# Patient Record
Sex: Male | Born: 1970 | Race: White | Hispanic: No | Marital: Single | Smoking: Current every day smoker
Health system: Southern US, Community
[De-identification: ages and names within clinical notes are randomized; demographics above are authoritative.]

## PROBLEM LIST (undated history)

## (undated) DIAGNOSIS — S060XAA Concussion with loss of consciousness status unknown, initial encounter: Secondary | ICD-10-CM

## (undated) HISTORY — DX: Concussion with loss of consciousness status unknown, initial encounter: S06.0XAA

---

## 2017-08-05 ENCOUNTER — Emergency Department (HOSPITAL_COMMUNITY): Payer: Self-pay

## 2017-08-05 ENCOUNTER — Emergency Department (HOSPITAL_COMMUNITY)
Admission: EM | Admit: 2017-08-05 | Discharge: 2017-08-05 | Disposition: A | Discharge status: Home / Self Care | Payer: Self-pay | Attending: Emergency Medicine | Admitting: Emergency Medicine

## 2017-08-05 DIAGNOSIS — R079 Chest pain, unspecified: Secondary | ICD-10-CM | POA: Insufficient documentation

## 2017-08-05 DIAGNOSIS — K2900 Acute gastritis without bleeding: Secondary | ICD-10-CM | POA: Insufficient documentation

## 2017-08-05 LAB — URINALYSIS, COMPLETE (UACMP) WITH MICROSCOPIC
Bilirubin Urine: NEGATIVE
Glucose, UA: NEGATIVE mg/dL
Hgb urine dipstick: NEGATIVE
Ketones, ur: 5 mg/dL — AB
Leukocytes, UA: NEGATIVE
Nitrite: NEGATIVE
Protein, ur: NEGATIVE mg/dL
Specific Gravity, Urine: >1.046 — ABNORMAL HIGH (ref 1.005–1.030)
Squamous Epithelial / LPF: NONE SEEN
pH: 7 (ref 5.0–8.0)

## 2017-08-05 LAB — COMPREHENSIVE METABOLIC PANEL
ALBUMIN: 4 g/dL (ref 3.5–5.0)
ALK PHOS: 80 U/L (ref 38–126)
ALT: 14 U/L — ABNORMAL LOW (ref 17–63)
ANION GAP: 11 (ref 5–15)
AST: 24 U/L (ref 15–41)
BUN: 16 mg/dL (ref 6–20)
CALCIUM: 9.2 mg/dL (ref 8.9–10.3)
CO2: 21 mmol/L — ABNORMAL LOW (ref 22–32)
Chloride: 102 mmol/L (ref 101–111)
Creatinine, Ser: 1.12 mg/dL (ref 0.61–1.24)
GLUCOSE: 96 mg/dL (ref 65–99)
Potassium: 4 mmol/L (ref 3.5–5.1)
Sodium: 134 mmol/L — ABNORMAL LOW (ref 135–145)
Total Bilirubin: 0.9 mg/dL (ref 0.3–1.2)
Total Protein: 6.9 g/dL (ref 6.5–8.1)

## 2017-08-05 LAB — RAPID URINE DRUG SCREEN, HOSP PERFORMED
Amphetamines: NOT DETECTED
Barbiturates: NOT DETECTED
Benzodiazepines: NOT DETECTED
Cocaine: NOT DETECTED
Opiates: NOT DETECTED
Tetrahydrocannabinol: POSITIVE — AB

## 2017-08-05 LAB — CBC
HEMATOCRIT: 39.7 % (ref 39.0–52.0)
HEMOGLOBIN: 13.8 g/dL (ref 13.0–17.0)
MCH: 29.8 pg (ref 26.0–34.0)
MCHC: 34.8 g/dL (ref 30.0–36.0)
MCV: 85.7 fL (ref 78.0–100.0)
Platelets: 262 10*3/uL (ref 150–400)
RBC: 4.63 MIL/uL (ref 4.22–5.81)
RDW: 13.1 % (ref 11.5–15.5)
WBC: 13.8 10*3/uL — ABNORMAL HIGH (ref 4.0–10.5)

## 2017-08-05 LAB — I-STAT VENOUS BLOOD GAS, ED
Acid-base deficit: 1 mmol/L (ref 0.0–2.0)
BICARBONATE: 22.4 mmol/L (ref 20.0–28.0)
O2 SAT: 46 %
PCO2 VEN: 32.5 mmHg — AB (ref 44.0–60.0)
TCO2: 23 mmol/L (ref 22–32)
pH, Ven: 7.447 — ABNORMAL HIGH (ref 7.250–7.430)
pO2, Ven: 24 mmHg — CL (ref 32.0–45.0)

## 2017-08-05 LAB — I-STAT CHEM 8, ED
BUN: 17 mg/dL (ref 6–20)
CALCIUM ION: 1.09 mmol/L — AB (ref 1.15–1.40)
CREATININE: 1.1 mg/dL (ref 0.61–1.24)
Chloride: 102 mmol/L (ref 101–111)
Glucose, Bld: 159 mg/dL — ABNORMAL HIGH (ref 65–99)
HCT: 44 % (ref 39.0–52.0)
Hemoglobin: 15 g/dL (ref 13.0–17.0)
Potassium: 3.9 mmol/L (ref 3.5–5.1)
Sodium: 137 mmol/L (ref 135–145)
TCO2: 22 mmol/L (ref 22–32)

## 2017-08-05 LAB — I-STAT TROPONIN, ED: Troponin i, poc: 0.01 ng/mL (ref 0.00–0.08)

## 2017-08-05 LAB — LIPASE, BLOOD: Lipase: 25 U/L (ref 11–51)

## 2017-08-05 LAB — ETHANOL: Alcohol, Ethyl (B): <10 mg/dL (ref <10)

## 2017-08-05 MED ORDER — FENTANYL CITRATE (PF) 100 MCG/2ML IJ SOLN: 50.0000 ug | Freq: Once | INTRAMUSCULAR | Status: DC

## 2017-08-05 MED ORDER — OMEPRAZOLE 20 MG PO CPDR: 20.0000 mg | DELAYED_RELEASE_CAPSULE | Freq: Every day | ORAL | 0 refills | Status: AC

## 2017-08-05 MED ORDER — MORPHINE SULFATE (PF) 4 MG/ML IV SOLN: 4.0000 mg | Freq: Once | INTRAVENOUS | Status: DC

## 2017-08-05 MED ORDER — SODIUM CHLORIDE 0.9 % IV BOLUS (SEPSIS)
1000.0000 mL | Freq: Once | INTRAVENOUS | Status: AC
  Administered 2017-08-05: 1000 mL via INTRAVENOUS

## 2017-08-05 MED ORDER — BUTALBITAL-APAP-CAFFEINE 50-325-40 MG PO TABS: 1.0000 | ORAL_TABLET | Freq: Four times a day (QID) | ORAL | 0 refills | Status: AC | PRN

## 2017-08-05 MED ORDER — ONDANSETRON HCL 4 MG/2ML IJ SOLN
INTRAMUSCULAR | Status: AC
  Filled 2017-08-05: qty 2

## 2017-08-05 MED ORDER — IOPAMIDOL (ISOVUE-370) INJECTION 76%
INTRAVENOUS | Status: AC
  Administered 2017-08-05: 100 mL
  Filled 2017-08-05: qty 100

## 2017-08-05 MED ORDER — ONDANSETRON HCL 4 MG/2ML IJ SOLN
4.0000 mg | Freq: Once | INTRAMUSCULAR | Status: AC
  Administered 2017-08-05: 4 mg via INTRAVENOUS

## 2017-08-05 MED ORDER — ONDANSETRON 4 MG PO TBDP: 4.0000 mg | ORAL_TABLET | Freq: Three times a day (TID) | ORAL | 0 refills | Status: DC | PRN

## 2017-08-05 MED ORDER — SUCRALFATE 1 G PO TABS: 1.0000 g | ORAL_TABLET | Freq: Three times a day (TID) | ORAL | 0 refills | Status: AC

## 2017-08-05 MED ORDER — GI COCKTAIL ~~LOC~~
30.0000 mL | Freq: Once | ORAL | Status: AC
  Administered 2017-08-05: 30 mL via ORAL
  Filled 2017-08-05: qty 30

## 2017-08-05 NOTE — ED Notes (Signed)
ED Provider at bedside. 
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
## 2017-08-05 NOTE — ED Triage Notes (Signed)
Pt in POV, visitors identify themselves as employeers and comes to the nurse first desk stating, " I need help. He is having a heart attack." this RN went to vehicle with NT and placed pt on stretcher from truck with limited help, pt alert and states, "I can't help you. I cannot stand." pt taken to triage for EKG and vitals, pt then starts to speak garbled language, EKG and vitals to be complete and pt will be taken to Trauma A room for physician eval

## 2017-08-05 NOTE — ED Provider Notes (Addendum)
[facility]   CSN: [id number] Arrival date & time: 08/05/17  1321     History   Chief Complaint Chief Complaint  Patient presents with  . Altered Mental Status    HPI Kerry Kassimothy Castelli is a 47 y.o. male.  HPI   47 year old male with no reported past medical history here with altered mental status.  History is somewhat limited due to patient severe distress and confusion.  According to his employers who brought him in, the patient is normally alert, oriented.  He showed up for his check today and was very confused.  Is complaining of severe chest and epigastric pain.  Since then, he has been increasingly confused, agitated, with severe epigastric pain radiating towards his back.  He said multiple episodes of nonbloody emesis.  He also reports that his lower extremities feel "numb" and "cold."  He has no history of previous similar symptoms.  Denies any numbness or weakness.  He does state he has some numbness in his right arm.  No known history of aortic disease.  He does smoke.  Denies any drug use.  Denies history of gastric ulcers or pancreatitis.  He was given aspirin by his coworkers prior to arrival.  Pain is sharp, stabbing, tearing.  No past medical history on file.  There are no active problems to display for this patient.    Home Medications    Prior to Admission medications   Medication Sig Start Date End Date Taking? Authorizing Provider  aspirin-acetaminophen-caffeine (EXCEDRIN MIGRAINE) [phone] MG tablet Take 3 tablets by mouth every 6 (six) hours as needed for migraine.    Yes [provider]  butalbital-acetaminophen-caffeine (FIORICET, ESGIC) 50-325-40 MG tablet Take 1 tablet by mouth every 6 (six) hours as needed for headache or migraine. 08/05/17 08/05/18  Shaune PollackIsaacs, Marjie Chea, MD  omeprazole (PRILOSEC) 20 MG capsule Take 1 capsule (20 mg total) by mouth daily for 14 days. 30 minutes before eating, first thing  in the morning 08/05/17 08/19/17  Shaune PollackIsaacs, Hasheem Voland, MD  ondansetron (ZOFRAN ODT) 4 MG disintegrating tablet Take 1 tablet (4 mg total) by mouth every 8 (eight) hours as needed for nausea or vomiting. 08/05/17   Shaune PollackIsaacs, Patsie Mccardle, MD  sucralfate (CARAFATE) 1 g tablet Take 1 tablet (1 g total) by mouth 4 (four) times daily -  with meals and at bedtime for 10 days. 08/05/17 08/15/17  Shaune PollackIsaacs, Keva Darty, MD    Family History No family history on file.  Social History Social History   Tobacco Use  . Smoking status: Not on file  Substance Use Topics  . Alcohol use: Not on file  . Drug use: Not on file     Allergies   Penicillins   Review of Systems Review of Systems  Constitutional: Positive for fatigue.  Gastrointestinal: Positive for abdominal pain, nausea and vomiting.  Neurological: Positive for weakness.  Psychiatric/Behavioral: Positive for confusion.  All other systems reviewed and are negative.    Physical Exam Updated Vital Signs BP (!) 102/45   Pulse (!) 59   Resp (!) 22   Ht 6' (1.829 m)   Wt 74.8 kg (165 lb)   SpO2 100%   BMI 22.38 kg/m   Physical Exam  Constitutional: He appears well-developed and well-nourished. He appears distressed.  HENT:  Head: Normocephalic and atraumatic.  markedly poor dentition  Eyes: Conjunctivae are normal.  Neck: Neck supple.  Cardiovascular: Normal rate, regular rhythm and normal heart sounds. Exam reveals no friction rub.  No murmur heard. Pulses are 2+ on right radial, 1+ otherwise  Pulmonary/Chest: Effort normal and breath sounds normal. No respiratory distress. He has no wheezes. He has no rales.  Abdominal: Soft. He exhibits no distension. There is tenderness. There is guarding.  Musculoskeletal: He exhibits no edema.  Neurological: He is alert. He exhibits normal muscle tone.  Oriented to person and place at this time, intermittently confused on time.  Moves all extremities with out of 5 strength.  Able to wiggle toes without  difficulty.  Speech appears normal.  Face is symmetric.  Skin: Skin is warm. Capillary refill takes less than 2 seconds.  Psychiatric: He has a normal mood and affect.  Nursing note and vitals reviewed.    ED Treatments / Results  Labs (all labs ordered are listed, but only abnormal results are displayed) Labs Reviewed  URINALYSIS, COMPLETE (UACMP) WITH MICROSCOPIC - Abnormal; Notable for the following components:      Result Value   Specific Gravity, Urine >1.046 (*)    Ketones, ur 5 (*)    Bacteria, UA RARE (*)    All other components within normal limits  RAPID URINE DRUG SCREEN, HOSP PERFORMED - Abnormal; Notable for the following components:   Tetrahydrocannabinol POSITIVE (*)    All other components within normal limits  CBC - Abnormal; Notable for the following components:   WBC 13.8 (*)    All other components within normal limits  COMPREHENSIVE METABOLIC PANEL - Abnormal; Notable for the following components:   Sodium 134 (*)    CO2 21 (*)    ALT 14 (*)    All other components within normal limits  I-STAT CHEM 8, ED - Abnormal; Notable for the following components:   Glucose, Bld 159 (*)    Calcium, Ion 1.09 (*)    All other components within normal limits  I-STAT VENOUS BLOOD GAS, ED - Abnormal; Notable for the following components:   pH, Ven 7.447 (*)    pCO2, Ven 32.5 (*)    pO2, Ven 24.0 (*)    All other components within normal limits  ETHANOL  LIPASE, BLOOD  CBC WITH DIFFERENTIAL/PLATELET  URINALYSIS, ROUTINE W REFLEX MICROSCOPIC  I-STAT TROPONIN, ED    EKG Normal sinus rhythm, VR 78. PR 134, QRS 94, QTc 471. Minimal criteria for LVH. No ST elevations or depressions. No old tracing to compare.  Radiology Ct Head Wo Contrast  Result Date: 08/05/2017 CLINICAL DATA:  Sudden onset altered level of consciousness. EXAM: CT HEAD WITHOUT CONTRAST TECHNIQUE: Contiguous axial images were obtained from the base of the skull through the vertex without intravenous  contrast. COMPARISON:  None. FINDINGS: Brain: No evidence of acute infarction, hemorrhage, hydrocephalus, extra-axial collection or mass lesion/mass effect. Vascular: No hyperdense vessel or unexpected calcification. Skull: No osseous abnormality. Sinuses/Orbits: Visualized paranasal sinuses are clear. Visualized mastoid sinuses are clear. Visualized orbits demonstrate no focal abnormality. Other: None IMPRESSION: No acute intracranial pathology. Electronically Signed   By: Elige Ko   On: 08/05/2017 14:18   Ct Angio Chest/abd/pel For Dissection W And/or Wo Contrast  Result Date: 08/05/2017 CLINICAL DATA:  Sudden onset of altered level of consciousness with upper abdominal pain and nausea. EXAM: CT ANGIOGRAPHY CHEST, ABDOMEN AND PELVIS TECHNIQUE: Multidetector CT imaging through the chest, abdomen and pelvis was performed using the standard protocol during bolus administration of intravenous contrast. Multiplanar reconstructed images and MIPs were obtained and reviewed to evaluate the vascular anatomy. CONTRAST:  ISOVUE-370 IOPAMIDOL (ISOVUE-370) INJECTION 76% COMPARISON:  None. FINDINGS: CTA CHEST FINDINGS Cardiovascular: The aorta appears normal. Branching pattern of the brachiocephalic vessels from the arch is normal with exception that the left vertebral artery arises directly from the arch. No brachiocephalic pathology seen to the level of the thyroid glands. The aorta shows normal caliber without atherosclerotic change, aneurysm or dissection. The patient does have a focal calcification in the left coronary system. Heart size is normal. No pericardial fluid. Pulmonary arteries are normal. Mediastinum/Nodes: No mass or lymphadenopathy. Lungs/Pleura: Normal Musculoskeletal: Normal Review of the MIP images confirms the above findings. CTA ABDOMEN AND PELVIS FINDINGS VASCULAR Aorta: Minimal atherosclerotic plaque.  No aneurysm or dissection. Celiac: Normal SMA: Normal Renals: Single on each side.   Wide patency. IMA: Normal Inflow: Normal Veins: No abnormality seen. Review of the MIP images confirms the above findings. NON-VASCULAR Hepatobiliary: Normal Pancreas: Normal Spleen: Normal Adrenals/Urinary Tract: Adrenal glands are normal. Kidneys are normal. Bladder is normal. Stomach/Bowel: Normal Lymphatic: Normal Reproductive: Normal Other: No free fluid or air. Musculoskeletal: Ordinary mild lumbar degenerative changes. Review of the MIP images confirms the above findings. IMPRESSION: No acute finding to explain the clinical presentation. The chest is normal with exception of a single punctate calcification in the left coronary system. Abdomen and pelvis are normal with exception of some atherosclerotic plaque of the abdominal aorta but no aneurysm or dissection. Branch vessels all appear normal. Electronically Signed   By: Paulina Fusi M.D.   On: 08/05/2017 14:24    Procedures .Critical Care Performed by: Shaune Pollack, MD Authorized by: Shaune Pollack, MD   Critical care provider statement:    Critical care time (minutes):  35   Critical care time was exclusive of:  Separately billable procedures and treating other patients and teaching time   Critical care was necessary to treat or prevent imminent or life-threatening deterioration of the following conditions:  Circulatory failure, cardiac failure and dehydration   Critical care was time spent personally by me on the following activities:  Development of treatment plan with patient or surrogate, discussions with consultants, evaluation of patient's response to treatment, examination of patient, obtaining history from patient or surrogate, ordering and performing treatments and interventions, ordering and review of laboratory studies, ordering and review of radiographic studies, pulse oximetry, re-evaluation of patient's condition and review of old charts   I assumed direction of critical care for this patient from another provider in my  specialty: no     (including critical care time)  Medications Ordered in ED Medications  fentaNYL (SUBLIMAZE) injection 50 mcg (0 mcg Intravenous Hold 08/05/17 1601)  ondansetron (ZOFRAN) 4 MG/2ML injection (not administered)  morphine 4 MG/ML injection 4 mg (0 mg Intravenous Hold 08/05/17 1602)  ondansetron (ZOFRAN) injection 4 mg (4 mg Intravenous Given 08/05/17 1424)  sodium chloride 0.9 % bolus 1,000 mL (0 mLs Intravenous Stopped 08/05/17 1539)  iopamidol (ISOVUE-370) 76 % injection (100 mLs  Contrast Given 08/05/17 1355)  gi cocktail (Maalox,Lidocaine,Donnatal) (30 mLs Oral Given 08/05/17 1733)  sodium chloride 0.9 % bolus 1,000 mL (0 mLs Intravenous Stopped 08/05/17 1734)     Initial Impression / Assessment and Plan / ED Course  I have reviewed the triage vital signs and the nursing notes.  Pertinent labs & imaging results that were available during my care of the patient were reviewed by me and considered in my medical decision making (see chart for details).     47 year old male with past medical history as above here with altered mental status and epigastric pain.  Patient in obvious distress on arrival.  Given his severe abdominal and chest pain radiating to his back with neurological symptoms and his degree of distress, concern for possible aortic dissection.  Also concern for possible intracranial bleed.  He has no focal deficits to suggest stroke.  Blood pressure is normal.  Will check CT head, sent for stat CT angios, and give fluids and pain control.  CT scan and labs are very reassuring.  Fortunately, there is no evidence of dissection or other acute normality.  The patient does have moderate dehydration and has been given fluids for this.  He feels markedly improved with GI cocktail.  I suspect his symptoms are likely due to severe gastritis, possibly with nonbleeding ulcer.  He also has positive for marijuana so could consider gastroparesis or marijuana induced hyperemesis.  He  is now tolerating p.o. without difficulty.  EKG is nonischemic.  Doubt ACS.  Will give him antacids, and alternative medicine for his migraines, and refer him to GI.  Final Clinical Impressions(s) / ED Diagnoses   Final diagnoses:  Acute superficial gastritis without hemorrhage    ED Discharge Orders        Ordered    omeprazole (PRILOSEC) 20 MG capsule  Daily     08/05/17 1806    sucralfate (CARAFATE) 1 g tablet  3 times daily with meals & bedtime     08/05/17 1806    butalbital-acetaminophen-caffeine (FIORICET, ESGIC) 50-325-40 MG tablet  Every 6 hours PRN     08/05/17 1806    ondansetron (ZOFRAN ODT) 4 MG disintegrating tablet  Every 8 hours PRN     08/05/17 1808       Shaune Pollack, MD 08/05/17 1810    Shaune Pollack, MD 08/15/17 1034

## 2017-08-05 NOTE — Discharge Instructions (Signed)
It is very important that you stop taking all Excedrin, aspirin, Advil, naproxen, or other NSAID medications  I prescribed a medication for you to take for your migraines.  Take this sparingly, as needed.  Take the full course of antacids as prescribed.

## 2017-08-05 NOTE — ED Notes (Signed)
Lab will add on CMP to previously collected sample

## 2017-08-05 NOTE — ED Notes (Signed)
Patient verbalizes understanding of discharge instructions. Opportunity for questioning and answers were provided. Armband removed by staff, pt discharged from ED ambulatory.   

## 2018-08-30 ENCOUNTER — Other Ambulatory Visit: Payer: Self-pay | Admitting: Occupational Medicine

## 2018-08-30 ENCOUNTER — Ambulatory Visit: Payer: Self-pay

## 2018-08-30 ENCOUNTER — Other Ambulatory Visit: Payer: Self-pay | Admitting: Family Medicine

## 2018-08-30 DIAGNOSIS — M79671 Pain in right foot: Secondary | ICD-10-CM

## 2018-12-10 ENCOUNTER — Emergency Department (HOSPITAL_COMMUNITY): Payer: Self-pay

## 2018-12-10 ENCOUNTER — Other Ambulatory Visit: Payer: Self-pay

## 2018-12-10 ENCOUNTER — Emergency Department (HOSPITAL_COMMUNITY)
Admission: EM | Admit: 2018-12-10 | Discharge: 2018-12-10 | Disposition: A | Discharge status: Home / Self Care | Payer: Self-pay | Attending: Emergency Medicine | Admitting: Emergency Medicine

## 2018-12-10 ENCOUNTER — Encounter (HOSPITAL_COMMUNITY): Payer: Self-pay | Admitting: Emergency Medicine

## 2018-12-10 DIAGNOSIS — S3991XA Unspecified injury of abdomen, initial encounter: Secondary | ICD-10-CM | POA: Insufficient documentation

## 2018-12-10 DIAGNOSIS — Y92838 Other recreation area as the place of occurrence of the external cause: Secondary | ICD-10-CM | POA: Insufficient documentation

## 2018-12-10 DIAGNOSIS — S299XXA Unspecified injury of thorax, initial encounter: Secondary | ICD-10-CM | POA: Insufficient documentation

## 2018-12-10 DIAGNOSIS — Y998 Other external cause status: Secondary | ICD-10-CM | POA: Insufficient documentation

## 2018-12-10 DIAGNOSIS — Y9355 Activity, bike riding: Secondary | ICD-10-CM | POA: Insufficient documentation

## 2018-12-10 DIAGNOSIS — S060X9A Concussion with loss of consciousness of unspecified duration, initial encounter: Secondary | ICD-10-CM | POA: Insufficient documentation

## 2018-12-10 LAB — URINALYSIS, ROUTINE W REFLEX MICROSCOPIC
Bilirubin Urine: NEGATIVE
Glucose, UA: NEGATIVE mg/dL
Hgb urine dipstick: NEGATIVE
Ketones, ur: 20 mg/dL — AB
Leukocytes,Ua: NEGATIVE
Nitrite: NEGATIVE
Protein, ur: NEGATIVE mg/dL
Specific Gravity, Urine: >1.046 — ABNORMAL HIGH (ref 1.005–1.030)
pH: 9 — ABNORMAL HIGH (ref 5.0–8.0)

## 2018-12-10 LAB — COMPREHENSIVE METABOLIC PANEL
ALT: 20 U/L (ref 0–44)
AST: 26 U/L (ref 15–41)
Albumin: 4.2 g/dL (ref 3.5–5.0)
Alkaline Phosphatase: 87 U/L (ref 38–126)
Anion gap: 14 (ref 5–15)
BUN: 15 mg/dL (ref 6–20)
CO2: 18 mmol/L — ABNORMAL LOW (ref 22–32)
Calcium: 9.5 mg/dL (ref 8.9–10.3)
Chloride: 102 mmol/L (ref 98–111)
Creatinine, Ser: 1.17 mg/dL (ref 0.61–1.24)
GFR calc Af Amer: >60 mL/min (ref >60)
GFR calc non Af Amer: >60 mL/min (ref >60)
Glucose, Bld: 173 mg/dL — ABNORMAL HIGH (ref 70–99)
Potassium: 3.5 mmol/L (ref 3.5–5.1)
Sodium: 134 mmol/L — ABNORMAL LOW (ref 135–145)
Total Bilirubin: 0.8 mg/dL (ref 0.3–1.2)
Total Protein: 7 g/dL (ref 6.5–8.1)

## 2018-12-10 LAB — I-STAT CHEM 8, ED
BUN: 16 mg/dL (ref 6–20)
Calcium, Ion: 1.02 mmol/L — ABNORMAL LOW (ref 1.15–1.40)
Chloride: 104 mmol/L (ref 98–111)
Creatinine, Ser: 1.1 mg/dL (ref 0.61–1.24)
Glucose, Bld: 168 mg/dL — ABNORMAL HIGH (ref 70–99)
HCT: 45 % (ref 39.0–52.0)
Hemoglobin: 15.3 g/dL (ref 13.0–17.0)
Potassium: 3.5 mmol/L (ref 3.5–5.1)
Sodium: 135 mmol/L (ref 135–145)
TCO2: 20 mmol/L — ABNORMAL LOW (ref 22–32)

## 2018-12-10 LAB — PROTIME-INR
INR: 1 (ref 0.8–1.2)
Prothrombin Time: 13 seconds (ref 11.4–15.2)

## 2018-12-10 LAB — CBC
HCT: 43.1 % (ref 39.0–52.0)
Hemoglobin: 15.4 g/dL (ref 13.0–17.0)
MCH: 30 pg (ref 26.0–34.0)
MCHC: 35.7 g/dL (ref 30.0–36.0)
MCV: 83.9 fL (ref 80.0–100.0)
Platelets: 282 10*3/uL (ref 150–400)
RBC: 5.14 MIL/uL (ref 4.22–5.81)
RDW: 13.1 % (ref 11.5–15.5)
WBC: 17.3 10*3/uL — ABNORMAL HIGH (ref 4.0–10.5)
nRBC: 0 % (ref 0.0–0.2)

## 2018-12-10 LAB — SAMPLE TO BLOOD BANK

## 2018-12-10 LAB — CDS SEROLOGY

## 2018-12-10 LAB — LACTIC ACID, PLASMA: Lactic Acid, Venous: 2.2 mmol/L (ref 0.5–1.9)

## 2018-12-10 LAB — RAPID URINE DRUG SCREEN, HOSP PERFORMED
Amphetamines: NOT DETECTED
Barbiturates: NOT DETECTED
Benzodiazepines: NOT DETECTED
Cocaine: NOT DETECTED
Opiates: NOT DETECTED
Tetrahydrocannabinol: POSITIVE — AB

## 2018-12-10 LAB — ETHANOL: Alcohol, Ethyl (B): <10 mg/dL (ref <10)

## 2018-12-10 MED ORDER — ONDANSETRON HCL 4 MG/2ML IJ SOLN
INTRAMUSCULAR | Status: AC
  Filled 2018-12-10: qty 2

## 2018-12-10 MED ORDER — SODIUM CHLORIDE 0.9 % IV SOLN
INTRAVENOUS | Status: DC
  Administered 2018-12-10: 15:00:00 via INTRAVENOUS

## 2018-12-10 MED ORDER — ONDANSETRON HCL 4 MG/2ML IJ SOLN
4.0000 mg | Freq: Once | INTRAMUSCULAR | Status: AC
  Administered 2018-12-10: 4 mg via INTRAVENOUS

## 2018-12-10 MED ORDER — IOHEXOL 300 MG/ML  SOLN
100.0000 mL | Freq: Once | INTRAMUSCULAR | Status: AC | PRN
  Administered 2018-12-10: 100 mL via INTRAVENOUS

## 2018-12-10 MED ORDER — SODIUM CHLORIDE 0.9 % IV BOLUS
1000.0000 mL | Freq: Once | INTRAVENOUS | Status: AC
  Administered 2018-12-10: 1000 mL via INTRAVENOUS

## 2018-12-10 NOTE — ED Notes (Signed)
Upon this RN rounding on patient, patient talking out of his head, speaking incoherently but stating "I have to piss!" patient attempting to get up and placed legs through rails of stretcher. this RN assisted patient to use urinal and readjust back in bed. Patient continued to speak incoherently. Vss, nad.

## 2018-12-10 NOTE — ED Notes (Signed)
Upon exiting restroom patient slammed door into wall causing the door handle to make a hole in the drywall.  This Clinical research associate has been in restroom prior to pt and no hole was present in the wall at that time.  Also no other patient or staff had entered that restroom in between.

## 2018-12-10 NOTE — ED Notes (Signed)
Pt is refusing to sign for d/c.  Spoke w/ pt's s/o Annabelle Harman and discussed d/c instructions, follow up care, return precautions and answered all questions.  Annabelle Harman verbalized understanding of all of the above.  Pt's friend is currently on the way to pick pt up.

## 2018-12-10 NOTE — ED Notes (Signed)
[phone] pts brother Johnnye Sima please update soon as possible
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
## 2018-12-10 NOTE — ED Notes (Signed)
DR. Criss Alvine aware of patient and advised that once patient is awake and alert enough to be discharged to let him know and he will assess patient and place discharge orders. Kriste Basque, RN  Made aware

## 2018-12-10 NOTE — ED Notes (Signed)
Pt lying on stretcher moving about - continues to moan intermittently and curse stating "Shit". Pt does not open his eyes.
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
## 2018-12-10 NOTE — ED Notes (Signed)
Pt transported to CT with this RN 
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
## 2018-12-10 NOTE — ED Notes (Signed)
Pt got OOB independently put his shorts on and ambulated to the bathroom w/ a steady gait.

## 2018-12-10 NOTE — ED Notes (Signed)
Pt noted to be lying on stretcher w/eyes closed. Pt continues to be confused - unaware of where he is or what occurred. Pt noted w/abrasion to lower back - no bleeding noted. Pt also noted to continue w/moaning.

## 2018-12-10 NOTE — ED Notes (Signed)
Pt continues to be moaning. RN attempted to have conversation w/pt - pt noted w/confusion.
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
## 2018-12-10 NOTE — ED Provider Notes (Signed)
Patient is awake and alert. Has improved. Unclear if initial AMS was drug or concussion related. Ambulates well. Now becoming somewhat agitated but talks clearly to me. Normal vitals. Appears stable for d/c home with family.   Pricilla Loveless, MD 12/11/18 0000

## 2018-12-10 NOTE — ED Notes (Signed)
Patient to remain in department until sober enough to safely be discharged.
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
## 2018-12-10 NOTE — ED Notes (Signed)
Opal Sidles pts significant other [phone] wants a pt update
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
## 2018-12-10 NOTE — Consult Note (Signed)
Responded to page from ED. Family not yet here, pt confused. Standing by.  Rev. Donnel Saxon Chaplain

## 2019-02-06 IMAGING — CT CT HEAD W/O CM
4 series · 16 of 47 positions shown, 18 images · non-contrast
Comparison: None.

CLINICAL DATA: Sudden onset altered level of consciousness.

EXAM:
CT HEAD WITHOUT CONTRAST
TECHNIQUE: Contiguous axial images were obtained from the base of the skull
through the vertex without intravenous contrast.

[Series 3: head without · axial · non-contrast · 0.45mm/px · z∈[-127,+8]mm · 7 of 37 slices shown, 9 images]
[im 5/37  brain]
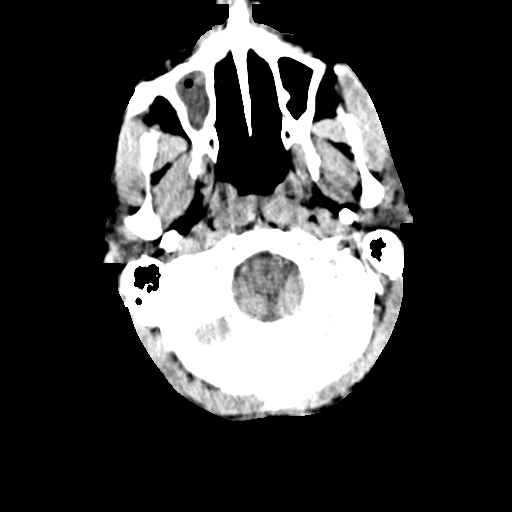
[im 5/37  bone]
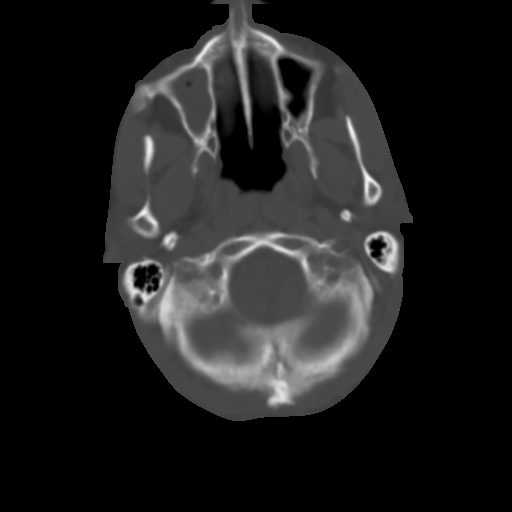
[im 10/37  brain]
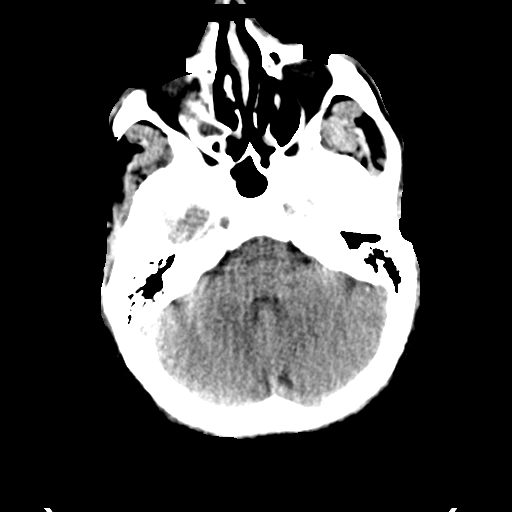
[im 14/37  brain]
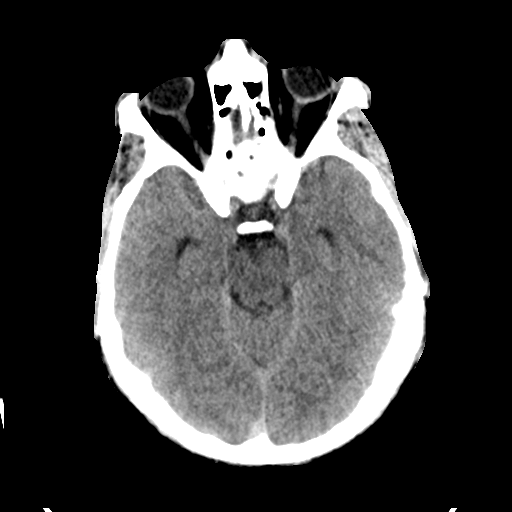
[im 19/37  brain]
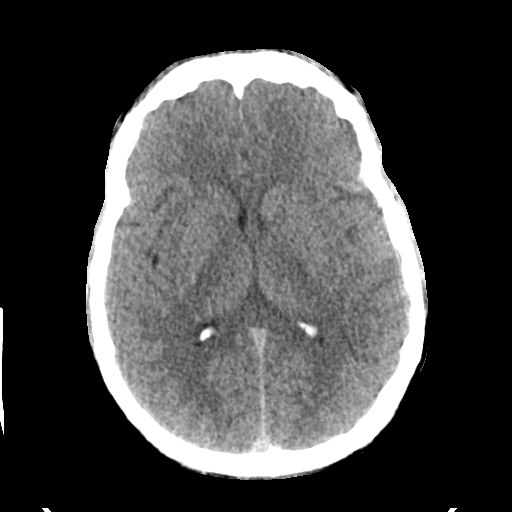
[im 23/37  brain]
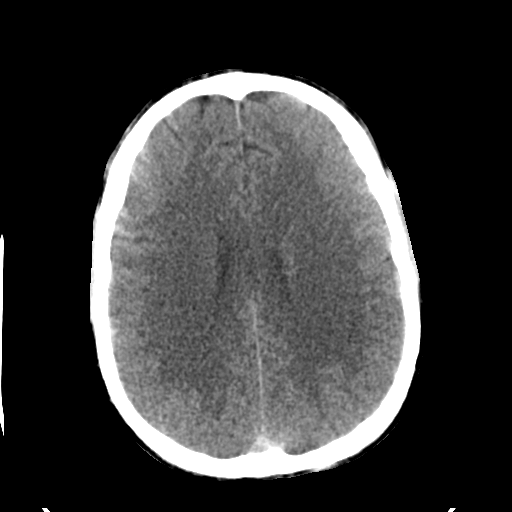
[im 23/37  bone]
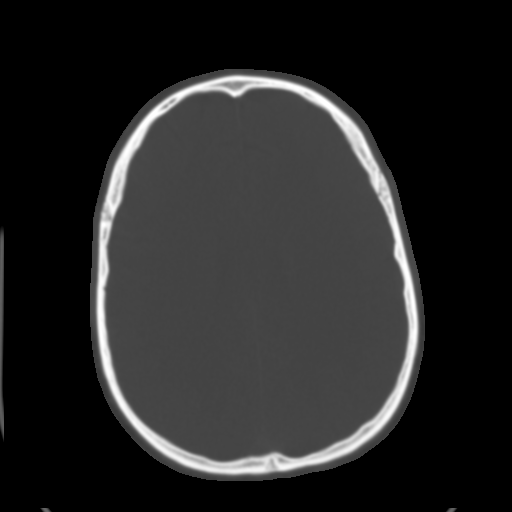
[im 28/37  brain]
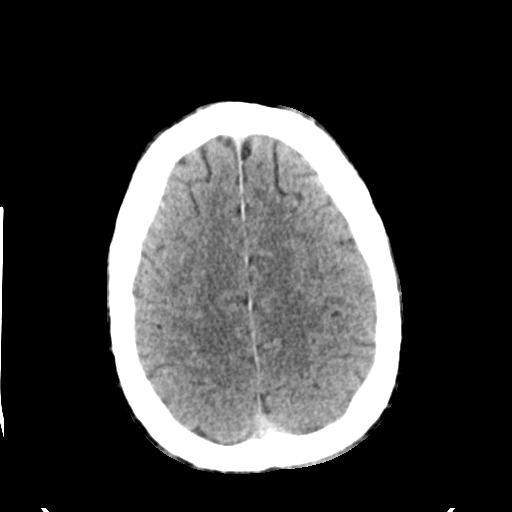
[im 32/37  brain]
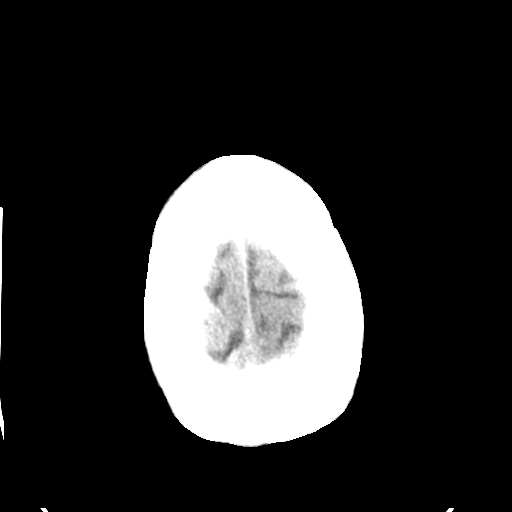

[Series 4: head bone · axial · 0.45mm/px · z∈[-129,-93]mm · 3 of 91 slices shown]
[im 10/91  bone]
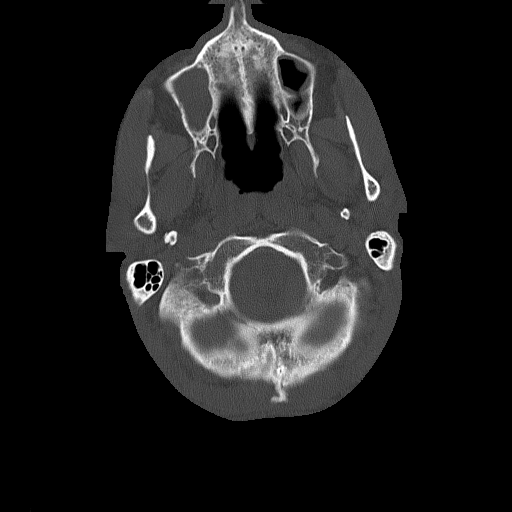
[im 19/91  bone]
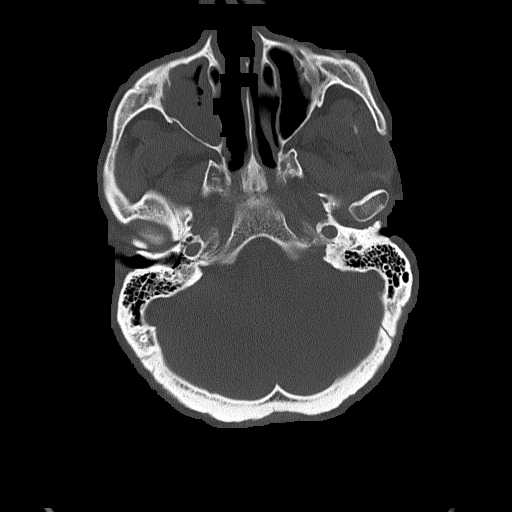
[im 28/91  bone]
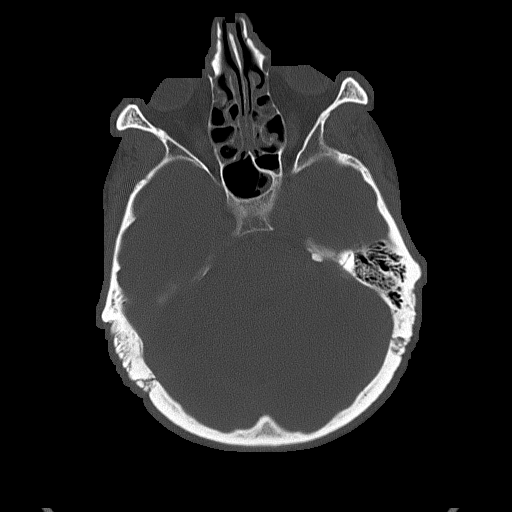

[Series 5: head without cor · coronal · non-contrast · 0.35mm/px · 3 of 69 slices shown]
[im 23/69  brain]
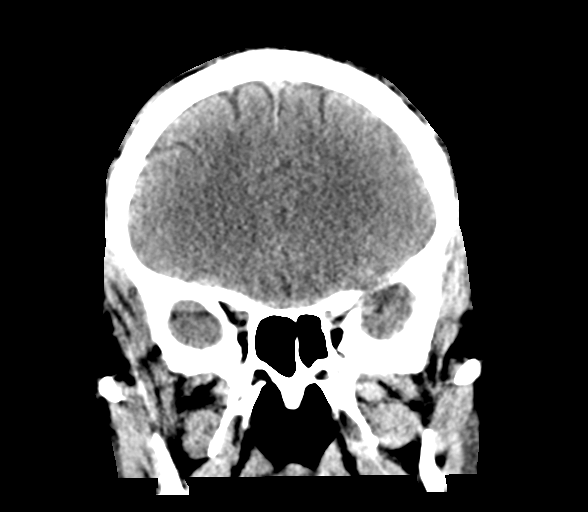
[im 31/69  brain]
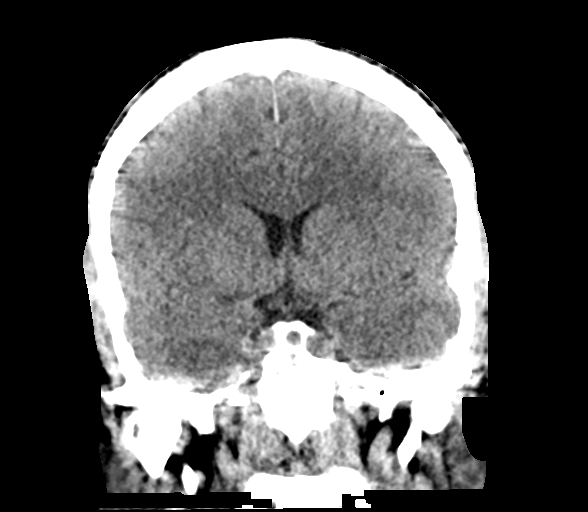
[im 38/69  brain]
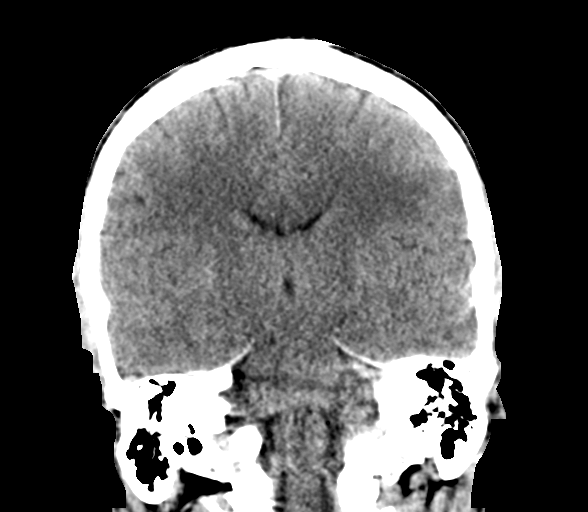

[Series 6: head without sag · sagittal · non-contrast · 0.35mm/px · 3 of 60 slices shown]
[im 20/60  brain]
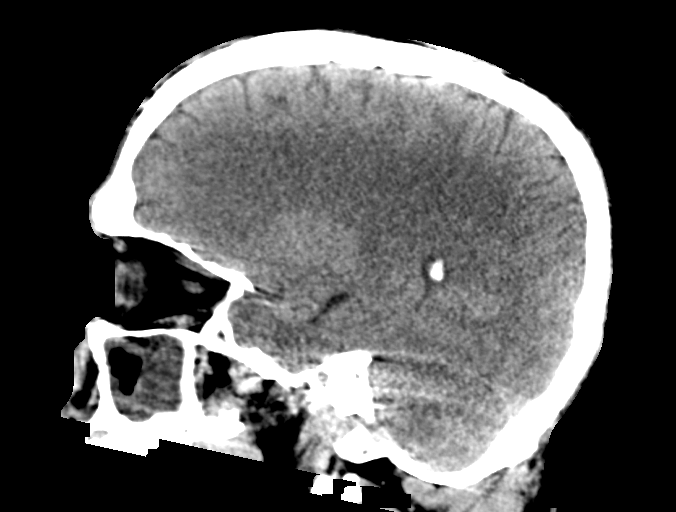
[im 30/60  brain]
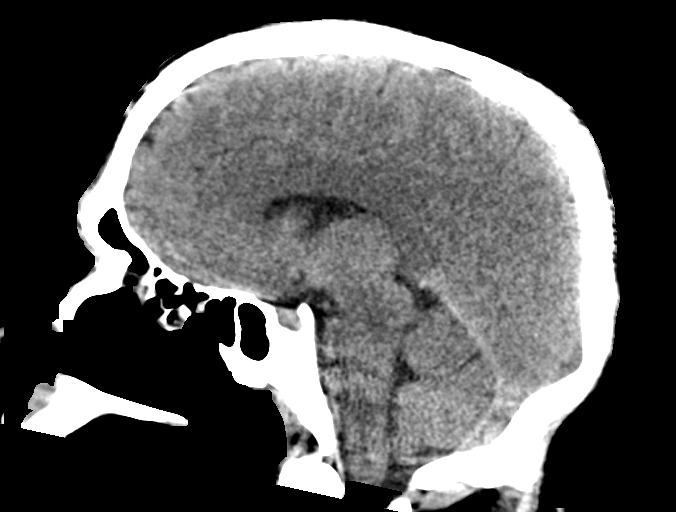
[im 40/60  brain]
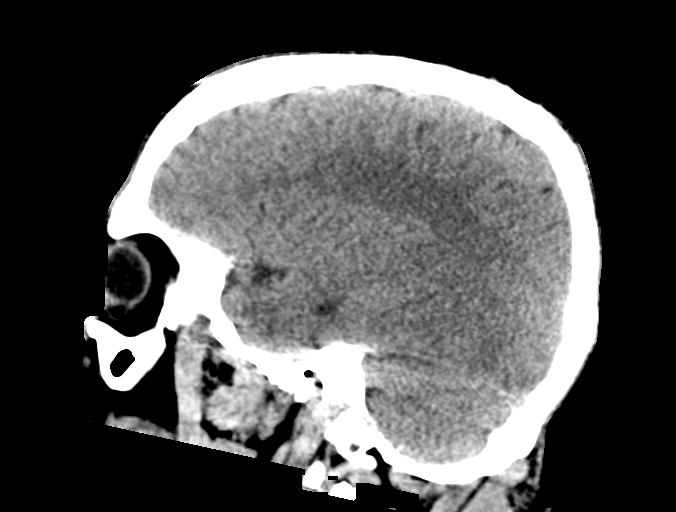

[16 of 47 positions shown; findings below may reference images not displayed]

FINDINGS: Brain: No evidence of acute infarction, hemorrhage, hydrocephalus,
extra-axial collection or mass lesion/mass effect.

Vascular: No hyperdense vessel or unexpected calcification.

Skull: No osseous abnormality.

Sinuses/Orbits: Visualized paranasal sinuses are clear. Visualized
mastoid sinuses are clear. Visualized orbits demonstrate no focal
abnormality.

Other: None
IMPRESSION: No acute intracranial pathology.

## 2022-03-21 ENCOUNTER — Encounter (HOSPITAL_COMMUNITY): Payer: Self-pay

## 2022-03-21 ENCOUNTER — Other Ambulatory Visit: Payer: Self-pay

## 2022-03-21 ENCOUNTER — Emergency Department (HOSPITAL_COMMUNITY)
Admission: EM | Admit: 2022-03-21 | Discharge: 2022-03-21 | Disposition: A | Discharge status: Home / Self Care | Payer: Self-pay | Attending: Emergency Medicine | Admitting: Emergency Medicine

## 2022-03-21 DIAGNOSIS — K047 Periapical abscess without sinus: Secondary | ICD-10-CM | POA: Insufficient documentation

## 2022-03-21 MED ORDER — CLINDAMYCIN HCL 300 MG PO CAPS
300.0000 mg | ORAL_CAPSULE | Freq: Once | ORAL | Status: AC
  Administered 2022-03-21: 300 mg via ORAL
  Filled 2022-03-21: qty 1

## 2022-03-21 MED ORDER — OXYCODONE-ACETAMINOPHEN 5-325 MG PO TABS
1.0000 | ORAL_TABLET | Freq: Once | ORAL | Status: AC
  Administered 2022-03-21: 1 via ORAL
  Filled 2022-03-21: qty 1

## 2022-03-21 MED ORDER — CLINDAMYCIN HCL 300 MG PO CAPS: 300.0000 mg | ORAL_CAPSULE | Freq: Three times a day (TID) | ORAL | 0 refills | Status: AC

## 2022-03-21 NOTE — ED Triage Notes (Addendum)
Pt reports dental pain and headache.  Swelling noted to left lower jaw x3 days.  Pt took old prescription for keflex he had at home.  Ibuprofen with no relief .

## 2022-03-21 NOTE — Discharge Instructions (Addendum)
You have a dental infection.Please take entire course of antibiotics as directed.  Continue using ibuprofen / Tylenol, as well as prescribed Orajel for pain.  You will need to follow-up with your dentist for continued management of this. Please see dental resources below. Return to the emergency department for fevers, swelling or pain under the tongue or in the neck, difficulty breathing or swallowing, nausea or vomiting that does not stop, or any other new or concerning symptoms.  

## 2022-03-21 NOTE — ED Provider Notes (Signed)
[facility]-EMERGENCY DEPT Provider Note   CSN: [id number] Arrival date & time: 03/21/22  1696     History  Chief Complaint  Patient presents with   Dental Pain    [facility] is a 51 y.o. male who presents his wife at the bedside with concern for 3 days of left lower jaw pain.  History of poor dentition, does not have dental insurance therefore is not followed up with a dentist as recommended.  Has been taking leftover cephalexin at home without improvement.  Ibuprofen with minimal relief.  I personally reviewed his medical records.  He otherwise does not carry medical diagnoses aside from peptic ulcer disease.  He is not on any medications daily.  HPI     Home Medications Prior to Admission medications   Medication Sig Start Date End Date Taking? Authorizing Provider  clindamycin (CLEOCIN) 300 MG capsule Take 1 capsule (300 mg total) by mouth 3 (three) times daily for 10 days. 03/21/22 03/31/22 Yes Strummer Canipe, Eugene Gavia, PA-C  aspirin-acetaminophen-caffeine (EXCEDRIN MIGRAINE) [phone] MG tablet Take 3 tablets by mouth every 6 (six) hours as needed for migraine.     [provider]  omeprazole (PRILOSEC) 20 MG capsule Take 1 capsule (20 mg total) by mouth daily for 14 days. 30 minutes before eating, first thing in the morning 08/05/17 08/19/17  Shaune Pollack, MD  ondansetron (ZOFRAN ODT) 4 MG disintegrating tablet Take 1 tablet (4 mg total) by mouth every 8 (eight) hours as needed for nausea or vomiting. 08/05/17   Shaune Pollack, MD  sucralfate (CARAFATE) 1 g tablet Take 1 tablet (1 g total) by mouth 4 (four) times daily -  with meals and at bedtime for 10 days. 08/05/17 08/15/17  Shaune Pollack, MD      Allergies    Penicillins    Review of Systems   Review of Systems  Constitutional: Negative.   HENT:  Positive for dental problem, ear discharge and facial swelling.     Physical Exam Updated Vital Signs BP (!) 143/104   Pulse 77   Temp 98.3  F (36.8 C)   Resp 18   Ht 5\' 11"  (1.803 m)   Wt 72 kg   SpO2 94%   BMI 22.14 kg/m  Physical Exam Vitals and nursing note reviewed.  Constitutional:      Appearance: He is not ill-appearing or toxic-appearing.  HENT:     Head: Normocephalic and atraumatic.     Mouth/Throat:     Dentition: Abnormal dentition. Dental tenderness, gingival swelling, dental caries and gum lesions present.     Pharynx: Oropharynx is clear. Uvula midline.      Comments: No sublingual or submental tenderness palpation.  No evidence of oropharyngeal abscess.  Tolerating secretions well. Eyes:     General: No scleral icterus.       Right eye: No discharge.        Left eye: No discharge.     Conjunctiva/sclera: Conjunctivae normal.  Neck:     Trachea: Trachea and phonation normal.  Pulmonary:     Effort: Pulmonary effort is normal.  Musculoskeletal:     Cervical back: Full passive range of motion without pain and normal range of motion. No crepitus.  Lymphadenopathy:     Cervical: No cervical adenopathy.  Skin:    General: Skin is warm and dry.  Neurological:     General: No focal deficit present.     Mental Status: He is alert.  Psychiatric:  Mood and Affect: Mood normal.     ED Results / Procedures / Treatments   Labs (all labs ordered are listed, but only abnormal results are displayed) Labs Reviewed - No data to display  EKG None  Radiology No results found.  Procedures Procedures   Medications Ordered in ED Medications  clindamycin (CLEOCIN) capsule 300 mg (has no administration in time range)  oxyCODONE-acetaminophen (PERCOCET/ROXICET) 5-325 MG per tablet 1 tablet (has no administration in time range)    ED Course/ Medical Decision Making/ A&P                           Medical Decision Making 51 year old male who presents with dental pain.  Hypertensive on intake, vitals otherwise normal.  Cardiopulmonary exam is normal.  Oropharyngeal exam as above consistent with  mildly poor dentition with multiple caries and visible decay.  Gingival swelling and adjacent buccal mucosal edema the left mandible adjacent to posterior molar.  No evidence of oropharyngeal abscess or Ludwick's angina on exam.  Risk Prescription drug management.   Dental infection treated with antibiotics and pain medication in the ED.  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  Will provide dental resources.  No further work-up warranted in the ED at this time.  Patient tolerating secretions well.  Clinical concern for emergent underlying etiology of patient's symptoms that would warrant further ED work-up or inpatient management is exceedingly low. David Williams and his wife  voiced understanding of his medical evaluation and treatment plan. Each of their questions answered to their expressed satisfaction.  Return precautions were given.  Patient is well-appearing, stable, and was discharged in good condition.  This chart was dictated using voice recognition software, Dragon. Despite the best efforts of this provider to proofread and correct errors, errors may still occur which can change documentation meaning.          Final Clinical Impression(s) / ED Diagnoses Final diagnoses:  Dental infection    Rx / DC Orders ED Discharge Orders          Ordered    clindamycin (CLEOCIN) 300 MG capsule  3 times daily        03/21/22 0621              Astaria Nanez, Eugene Gavia, PA-C 03/21/22 2878    Shon Baton, MD 03/21/22 [id number]

## 2022-04-02 ENCOUNTER — Emergency Department (HOSPITAL_COMMUNITY): Payer: Self-pay

## 2022-04-02 ENCOUNTER — Emergency Department (HOSPITAL_COMMUNITY)
Admission: EM | Admit: 2022-04-02 | Discharge: 2022-04-02 | Disposition: A | Discharge status: Home / Self Care | Payer: Self-pay | Attending: Emergency Medicine | Admitting: Emergency Medicine

## 2022-04-02 ENCOUNTER — Encounter (HOSPITAL_COMMUNITY): Payer: Self-pay

## 2022-04-02 DIAGNOSIS — Z7982 Long term (current) use of aspirin: Secondary | ICD-10-CM | POA: Insufficient documentation

## 2022-04-02 DIAGNOSIS — E860 Dehydration: Secondary | ICD-10-CM | POA: Insufficient documentation

## 2022-04-02 DIAGNOSIS — Z79899 Other long term (current) drug therapy: Secondary | ICD-10-CM | POA: Insufficient documentation

## 2022-04-02 DIAGNOSIS — R55 Syncope and collapse: Secondary | ICD-10-CM | POA: Insufficient documentation

## 2022-04-02 LAB — BASIC METABOLIC PANEL
Anion gap: 9 (ref 5–15)
BUN: 19 mg/dL (ref 6–20)
CO2: 20 mmol/L — ABNORMAL LOW (ref 22–32)
Calcium: 9.6 mg/dL (ref 8.9–10.3)
Chloride: 111 mmol/L (ref 98–111)
Creatinine, Ser: 1.07 mg/dL (ref 0.61–1.24)
GFR, Estimated: >60 mL/min (ref >60)
Glucose, Bld: 145 mg/dL — ABNORMAL HIGH (ref 70–99)
Potassium: 3.8 mmol/L (ref 3.5–5.1)
Sodium: 140 mmol/L (ref 135–145)

## 2022-04-02 LAB — CBC WITH DIFFERENTIAL/PLATELET
Abs Immature Granulocytes: 0.06 10*3/uL (ref 0.00–0.07)
Basophils Absolute: 0.1 10*3/uL (ref 0.0–0.1)
Basophils Relative: 1 %
Eosinophils Absolute: 0.1 10*3/uL (ref 0.0–0.5)
Eosinophils Relative: 1 %
HCT: 40.5 % (ref 39.0–52.0)
Hemoglobin: 14.4 g/dL (ref 13.0–17.0)
Immature Granulocytes: 1 %
Lymphocytes Relative: 26 %
Lymphs Abs: 3.3 10*3/uL (ref 0.7–4.0)
MCH: 30.1 pg (ref 26.0–34.0)
MCHC: 35.6 g/dL (ref 30.0–36.0)
MCV: 84.7 fL (ref 80.0–100.0)
Monocytes Absolute: 0.7 10*3/uL (ref 0.1–1.0)
Monocytes Relative: 6 %
Neutro Abs: 8.1 10*3/uL — ABNORMAL HIGH (ref 1.7–7.7)
Neutrophils Relative %: 65 %
Platelets: 358 10*3/uL (ref 150–400)
RBC: 4.78 MIL/uL (ref 4.22–5.81)
RDW: 13.2 % (ref 11.5–15.5)
WBC: 12.3 10*3/uL — ABNORMAL HIGH (ref 4.0–10.5)
nRBC: 0 % (ref 0.0–0.2)

## 2022-04-02 LAB — CBG MONITORING, ED: Glucose-Capillary: 96 mg/dL (ref 70–99)

## 2022-04-02 MED ORDER — SODIUM CHLORIDE 0.9 % IV SOLN: INTRAVENOUS | Status: DC

## 2022-04-02 MED ORDER — SODIUM CHLORIDE 0.9 % IV BOLUS
1000.0000 mL | Freq: Once | INTRAVENOUS | Status: AC
  Administered 2022-04-02: 1000 mL via INTRAVENOUS

## 2022-04-02 MED ORDER — ONDANSETRON 4 MG PO TBDP: 4.0000 mg | ORAL_TABLET | Freq: Three times a day (TID) | ORAL | 0 refills | Status: DC | PRN

## 2022-04-02 MED ORDER — LORAZEPAM 2 MG/ML IJ SOLN: 1.0000 mg | Freq: Once | INTRAMUSCULAR | Status: DC

## 2022-04-02 MED ORDER — IOHEXOL 300 MG/ML  SOLN
100.0000 mL | Freq: Once | INTRAMUSCULAR | Status: AC | PRN
  Administered 2022-04-02: 100 mL via INTRAVENOUS

## 2022-04-02 MED ORDER — ONDANSETRON 4 MG PO TBDP: 4.0000 mg | ORAL_TABLET | Freq: Three times a day (TID) | ORAL | 0 refills | Status: AC | PRN

## 2022-04-02 MED ORDER — SODIUM CHLORIDE 0.9 % IV SOLN
25.0000 mg | Freq: Once | INTRAVENOUS | Status: DC
  Filled 2022-04-02: qty 1

## 2022-04-02 MED ORDER — ONDANSETRON HCL 4 MG/2ML IJ SOLN
4.0000 mg | Freq: Once | INTRAMUSCULAR | Status: AC
  Administered 2022-04-02: 4 mg via INTRAVENOUS
  Filled 2022-04-02: qty 2

## 2022-04-02 NOTE — ED Triage Notes (Signed)
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 No head trauma. Endorses nausea and dizziness. 18ga LAC. 500NS. Hx peptic ulcer.  88/50 to 106/58 HR 70 96% RA 102 CBG

## 2022-04-02 NOTE — ED Notes (Signed)
Pt used to restroom, but no sample provided.

## 2022-04-02 NOTE — ED Provider Notes (Signed)
[facility]-EMERGENCY DEPT Provider Note   CSN: [id number] Arrival date & time: 04/02/22  1310     History  Chief Complaint  Patient presents with   Loss of Consciousness   Nausea    David Williams is a 51 y.o. male.  Pt is a 51 yo male with pmhx significant for PUD.  EMS said pt was at work in a [employer] when he slumped over.  EMS said pt did not fall out of the [place].  Pt said he felt fine this am, but remembers feeling nauseous and dizzy. He was able to turn the [employer] off prior to passing out.   EMS said initial bp was in the 80s.  He was given 500 cc NS en route.  Pt has some abd pain, but he mostly feels nauseous.  Pt said he has not eat or drank today.       Home Medications Prior to Admission medications   Medication Sig Start Date End Date Taking? Authorizing Provider  ondansetron (ZOFRAN-ODT) 4 MG disintegrating tablet Take 1 tablet (4 mg total) by mouth every 8 (eight) hours as needed for nausea or vomiting. 04/02/22  Yes Jacalyn Lefevre, MD  aspirin-acetaminophen-caffeine (EXCEDRIN MIGRAINE) [phone] MG tablet Take 3 tablets by mouth every 6 (six) hours as needed for migraine.     [provider]  omeprazole (PRILOSEC) 20 MG capsule Take 1 capsule (20 mg total) by mouth daily for 14 days. 30 minutes before eating, first thing in the morning 08/05/17 08/19/17  Shaune Pollack, MD  sucralfate (CARAFATE) 1 g tablet Take 1 tablet (1 g total) by mouth 4 (four) times daily -  with meals and at bedtime for 10 days. 08/05/17 08/15/17  Shaune Pollack, MD      Allergies    Penicillins    Review of Systems   Review of Systems  Gastrointestinal:  Positive for nausea and vomiting.  Neurological:  Positive for dizziness.  All other systems reviewed and are negative.   Physical Exam Updated Vital Signs BP 133/72   Pulse 73   Temp (!) 97.5 F (36.4 C) (Oral)   Resp 18   Ht 5\' 11"  (1.803 m)   Wt 72 kg   SpO2 98%   BMI 22.14 kg/m  Physical  Exam Vitals and nursing note reviewed.  Constitutional:      Appearance: Normal appearance.  HENT:     Head: Normocephalic and atraumatic.     Comments: Poor dentition    Right Ear: External ear normal.     Left Ear: External ear normal.     Nose: Nose normal.     Mouth/Throat:     Mouth: Mucous membranes are dry.  Eyes:     Extraocular Movements: Extraocular movements intact.     Conjunctiva/sclera: Conjunctivae normal.     Pupils: Pupils are equal, round, and reactive to light.  Cardiovascular:     Rate and Rhythm: Normal rate and regular rhythm.     Pulses: Normal pulses.     Heart sounds: Normal heart sounds.  Pulmonary:     Effort: Pulmonary effort is normal.     Breath sounds: Normal breath sounds.  Abdominal:     General: Abdomen is flat. Bowel sounds are normal.     Palpations: Abdomen is soft.  Musculoskeletal:        General: Normal range of motion.     Cervical back: Normal range of motion and neck supple.  Skin:    General: Skin  is warm.     Capillary Refill: Capillary refill takes less than 2 seconds.  Neurological:     General: No focal deficit present.     Mental Status: He is alert and oriented to person, place, and time.  Psychiatric:        Mood and Affect: Mood normal.        Behavior: Behavior normal.     ED Results / Procedures / Treatments   Labs (all labs ordered are listed, but only abnormal results are displayed) Labs Reviewed  BASIC METABOLIC PANEL - Abnormal; Notable for the following components:      Result Value   CO2 20 (*)    Glucose, Bld 145 (*)    All other components within normal limits  CBC WITH DIFFERENTIAL/PLATELET - Abnormal; Notable for the following components:   WBC 12.3 (*)    Neutro Abs 8.1 (*)    All other components within normal limits  URINALYSIS, ROUTINE W REFLEX MICROSCOPIC  RAPID URINE DRUG SCREEN, HOSP PERFORMED  CBG MONITORING, ED    EKG EKG Interpretation  Date/Time:  Friday April 02 2022 13:30:14  EDT Ventricular Rate:  67 PR Interval:  145 QRS Duration: 99 QT Interval:  444 QTC Calculation: 469 R Axis:   65 Text Interpretation: Sinus rhythm Probable left atrial enlargement RSR' in V1 or V2, right VCD or RVH No significant change since last tracing Confirmed by Jacalyn Lefevre [phone]) on 04/02/2022 1:43:30 PM  Radiology CT ABDOMEN PELVIS W CONTRAST  Result Date: 04/02/2022 CLINICAL DATA:  Nausea and dizziness. EXAM: CT ABDOMEN AND PELVIS WITH CONTRAST TECHNIQUE: Multidetector CT imaging of the abdomen and pelvis was performed using the standard protocol following bolus administration of intravenous contrast. RADIATION DOSE REDUCTION: This exam was performed according to the departmental dose-optimization program which includes automated exposure control, adjustment of the mA and/or kV according to patient size and/or use of iterative reconstruction technique. CONTRAST:  OMNIPAQUE IOHEXOL 300 MG/ML  SOLN COMPARISON:  CT chest, abdomen, and pelvis dated August 05, 2017. FINDINGS: Lower chest: Mild dependent subsegmental atelectasis in both posterior lower lobes. Hepatobiliary: No focal liver abnormality is seen. No gallstones, gallbladder wall thickening, or biliary dilatation. Pancreas: Unremarkable. No pancreatic ductal dilatation or surrounding inflammatory changes. Spleen: Normal in size without focal abnormality. Adrenals/Urinary Tract: Adrenal glands are unremarkable. Kidneys are normal, without renal calculi, focal lesion, or hydronephrosis. Bladder is unremarkable. Stomach/Bowel: Stomach is within normal limits. Appendix appears normal. No evidence of bowel wall thickening, distention, or inflammatory changes. Vascular/Lymphatic: Aortic atherosclerosis. No enlarged abdominal or pelvic lymph nodes. Reproductive: Prostate is unremarkable. Other: Unchanged tiny fat containing umbilical hernia. No free fluid or pneumoperitoneum. Musculoskeletal: No acute or significant osseous findings.  IMPRESSION: 1. No acute intra-abdominal process. 2. Aortic Atherosclerosis (ICD10-I70.0). Electronically Signed   By: Obie Dredge M.D.   On: 04/02/2022 14:59   CT Head Wo Contrast  Result Date: 04/02/2022 CLINICAL DATA:  Syncope. EXAM: CT HEAD WITHOUT CONTRAST TECHNIQUE: Contiguous axial images were obtained from the base of the skull through the vertex without intravenous contrast. RADIATION DOSE REDUCTION: This exam was performed according to the departmental dose-optimization program which includes automated exposure control, adjustment of the mA and/or kV according to patient size and/or use of iterative reconstruction technique. COMPARISON:  CT head dated August 05, 2017. FINDINGS: Brain: No evidence of acute infarction, hemorrhage, hydrocephalus, extra-axial collection or mass lesion/mass effect. Vascular: No hyperdense vessel or unexpected calcification. Skull: Normal. Negative for fracture or focal lesion. Sinuses/Orbits:  No acute finding. Other: None. IMPRESSION: 1. No acute intracranial abnormality. Electronically Signed   By: Obie Dredge M.D.   On: 04/02/2022 14:54   DG Chest Port 1 View  Result Date: 04/02/2022 CLINICAL DATA:  Syncope.  Nausea and dizziness. EXAM: PORTABLE CHEST 1 VIEW COMPARISON:  CTA chest, abdomen, and pelvis 08/05/2017 FINDINGS: The cardiomediastinal silhouette is within normal limits. No airspace consolidation, edema, sizable pleural effusion, or pneumothorax is identified. No acute osseous abnormality is seen. IMPRESSION: No active disease. Electronically Signed   By: Sebastian Ache M.D.   On: 04/02/2022 14:08    Procedures Procedures    Medications Ordered in ED Medications  sodium chloride 0.9 % bolus 1,000 mL (0 mLs Intravenous Stopped 04/02/22 1603)    And  0.9 %  sodium chloride infusion (has no administration in time range)  ondansetron (ZOFRAN) injection 4 mg (4 mg Intravenous Given 04/02/22 1341)  iohexol (OMNIPAQUE) 300 MG/ML solution 100 mL (100  mLs Intravenous Contrast Given 04/02/22 1432)    ED Course/ Medical Decision Making/ A&P                           Medical Decision Making Amount and/or Complexity of Data Reviewed Labs: ordered. Radiology: ordered. ECG/medicine tests: ordered.  Risk Prescription drug management.   This patient presents to the ED for concern of syncope, this involves an extensive number of treatment options, and is a complaint that carries with it a high risk of complications and morbidity.  The differential diagnosis includes vasovagal, cardiogenic, orthostatic   Co morbidities that complicate the patient evaluation  PUD   Additional history obtained:  Additional history obtained from epic chart review External records from outside source obtained and reviewed including EMS report   Lab Tests:  I Ordered, and personally interpreted labs.  The pertinent results include:  cbc with wbc slightly elevated at 12.3, bmp nl, pt did not want to submit a urine sample.   Imaging Studies ordered:  I ordered imaging studies including ct abd/pelvis, ct head, cxr  I independently visualized and interpreted imaging which showed  CXR: IMPRESSION:  No active disease.  CT head: IMPRESSION:  1. No acute intracranial abnormality.  CT abd/pelvis: IMPRESSION:  1. No acute intra-abdominal process.  2. Aortic Atherosclerosis (ICD10-I70.0).   I agree with the radiologist interpretation   Cardiac Monitoring:  The patient was maintained on a cardiac monitor.  I personally viewed and interpreted the cardiac monitored which showed an underlying rhythm of: nsr   Medicines ordered and prescription drug management:  I ordered medication including ivfs  for syncope  Reevaluation of the patient after these medicines showed that the patient improved I have reviewed the patients home medicines and have made adjustments as needed   Test Considered:  ct   Critical Interventions:  ivfs   Problem  List / ED Course:  Syncope:  likely due to dehydration.  Pt feels much better after ivfs. He wants to go home.  He is stable for d/c.   Reevaluation:  After the interventions noted above, I reevaluated the patient and found that they have :improved   Social Determinants of Health:  No pcp/insurance   Dispostion:  After consideration of the diagnostic results and the patients response to treatment, I feel that the patent would benefit from discharge with outpatient f/u.          Final Clinical Impression(s) / ED Diagnoses Final diagnoses:  Dehydration  Syncope,  unspecified syncope type    Rx / DC Orders ED Discharge Orders          Ordered    ondansetron (ZOFRAN-ODT) 4 MG disintegrating tablet  Every 8 hours PRN        04/02/22 1606              Jacalyn Lefevre, MD 04/02/22 1609
